# Patient Record
Sex: Female | Born: 1965 | Race: White | Hispanic: No | Marital: Single | Smoking: Never smoker
Health system: Southern US, Community
[De-identification: ages and names within clinical notes are randomized; demographics above are authoritative.]

## PROBLEM LIST (undated history)

## (undated) DIAGNOSIS — Z789 Other specified health status: Secondary | ICD-10-CM

## (undated) HISTORY — DX: Other specified health status: Z78.9

## (undated) HISTORY — PX: APPENDECTOMY: SHX54

## (undated) HISTORY — PX: INTRAUTERINE DEVICE (IUD) INSERTION: SHX5877

---

## 2004-02-20 ENCOUNTER — Ambulatory Visit: Payer: Self-pay | Admitting: General Surgery

## 2004-08-09 ENCOUNTER — Ambulatory Visit: Payer: Self-pay | Admitting: Unknown Physician Specialty

## 2005-12-28 ENCOUNTER — Ambulatory Visit: Payer: Self-pay | Admitting: Unknown Physician Specialty

## 2006-12-29 ENCOUNTER — Ambulatory Visit: Payer: Self-pay | Admitting: Gastroenterology

## 2007-05-17 ENCOUNTER — Ambulatory Visit: Payer: Self-pay | Admitting: Unknown Physician Specialty

## 2008-09-18 ENCOUNTER — Ambulatory Visit: Payer: Self-pay | Admitting: Unknown Physician Specialty

## 2009-10-27 ENCOUNTER — Ambulatory Visit: Payer: Self-pay | Admitting: Unknown Physician Specialty

## 2010-11-11 ENCOUNTER — Ambulatory Visit: Payer: Self-pay | Admitting: Unknown Physician Specialty

## 2011-06-09 ENCOUNTER — Ambulatory Visit: Payer: Self-pay | Admitting: Obstetrics and Gynecology

## 2012-03-06 LAB — HM PAP SMEAR: HM Pap smear: NORMAL

## 2012-07-06 ENCOUNTER — Ambulatory Visit: Payer: Self-pay | Admitting: Obstetrics and Gynecology

## 2016-03-29 ENCOUNTER — Ambulatory Visit (INDEPENDENT_AMBULATORY_CARE_PROVIDER_SITE_OTHER): Payer: Federal, State, Local not specified - PPO | Admitting: Obstetrics and Gynecology

## 2016-03-29 ENCOUNTER — Encounter: Payer: Self-pay | Admitting: Obstetrics and Gynecology

## 2016-03-29 VITALS — BP 122/74 | Ht 63.0 in | Wt 210.0 lb

## 2016-03-29 DIAGNOSIS — Z1231 Encounter for screening mammogram for malignant neoplasm of breast: Secondary | ICD-10-CM | POA: Diagnosis not present

## 2016-03-29 DIAGNOSIS — Z124 Encounter for screening for malignant neoplasm of cervix: Secondary | ICD-10-CM

## 2016-03-29 DIAGNOSIS — Z01419 Encounter for gynecological examination (general) (routine) without abnormal findings: Secondary | ICD-10-CM | POA: Diagnosis not present

## 2016-03-29 DIAGNOSIS — Z1239 Encounter for other screening for malignant neoplasm of breast: Secondary | ICD-10-CM

## 2016-03-29 NOTE — Progress Notes (Signed)
Routine Annual Gynecology Examination   PCP: No PCP Per Patient  Chief Complaint  Patient presents with  . Annual Exam  . Breast Pain    History of Present Illness: Patient is a 51 y.o. G1P0010 presents for annual exam. The patient has no complaints today.   Menopausal bleeding: denies  Menopausal symptoms: denies  Breast symptoms: denies  Last pap smear: 4 years ago.  Result Normal, HPV negative  Last mammogram: 2 years ago.  Result Normal  She briefly mentions a sharp, shooting breast pain that occurred one month ago.  No residual symptoms.  IUD placed in 05/2011.   Past Medical History:  History reviewed. No pertinent past medical history.  Past Surgical History:  Procedure Laterality Date  . INTRAUTERINE DEVICE (IUD) INSERTION    . INTRAUTERINE DEVICE (IUD) INSERTION      Medications:   Medication Sig Start Date End Date Taking? Authorizing Provider  levonorgestrel (MIRENA) 20 MCG/24HR IUD 1 each by Intrauterine route once.   Yes Historical Provider, MD   Allergies  Allergen Reactions  . Sulfur     Gynecologic History:  No LMP recorded. Patient is not currently having periods (Reason: IUD). Contraception: IUD  Last colonoscopy: 4 years ago with colon polyp  Obstetric History: G1P0010  Social History   Social History  . Marital status: Single    Spouse name: N/A  . Number of children: N/A  . Years of education: N/A   Occupational History  . Not on file.   Social History Main Topics  . Smoking status: Never Smoker  . Smokeless tobacco: Never Used  . Alcohol use Yes  . Drug use: No  . Sexual activity: Not Currently    Birth control/ protection: IUD   Other Topics Concern  . Not on file   Social History Narrative  . No narrative on file   Family History  Problem Relation Age of Onset  . Diabetes Mother   . Prostate cancer Father   . Breast cancer Maternal Aunt   . Colon cancer Paternal Uncle     Review of Systems  Constitutional:  Negative.   HENT: Negative.   Eyes: Negative.   Respiratory: Negative.   Cardiovascular: Negative.   Gastrointestinal: Negative.   Genitourinary: Negative.   Skin: Negative.   Neurological: Negative.   Endo/Heme/Allergies: Negative.   Psychiatric/Behavioral: Negative.      Physical Exam Vitals: BP 122/74   Ht 5\' 3"  (1.6 m)   Wt 210 lb (95.3 kg)   BMI 37.20 kg/m   Physical Exam  Constitutional: She is oriented to person, place, and time and well-developed, well-nourished, and in no distress. No distress.  HENT:  Head: Normocephalic and atraumatic.  Eyes: Conjunctivae and EOM are normal. Right eye exhibits no discharge. Left eye exhibits no discharge. No scleral icterus.  Neck: Normal range of motion. Neck supple. No tracheal deviation present. No thyromegaly present.  Cardiovascular: Normal rate and normal heart sounds.  Exam reveals no gallop and no friction rub.   No murmur heard. Pulmonary/Chest: Effort normal and breath sounds normal. No respiratory distress. She has no wheezes. She has no rales. Right breast exhibits no inverted nipple, no mass, no nipple discharge, no skin change and no tenderness. Left breast exhibits no inverted nipple, no mass, no nipple discharge, no skin change and no tenderness. Breasts are symmetrical.  Abdominal: Soft. Bowel sounds are normal. She exhibits no distension and no mass. There is no tenderness. There is no rebound and no  guarding.  Genitourinary: Vagina normal, uterus normal, cervix normal, right adnexa normal, left adnexa normal and vulva normal.  Musculoskeletal: Normal range of motion.  Lymphadenopathy:    She has no cervical adenopathy.  Neurological: She is alert and oriented to person, place, and time. No cranial nerve deficit.  Skin: Skin is warm and dry. No rash noted. No erythema. No pallor.  Psychiatric: Mood, affect and judgment normal.     Female chaperone present for pelvic and breast  portions of the physical  exam  Results: Patient declines formal depression and alcoholism screening.  But states she has no issues.  Assessment and Plan:  51 y.o. G1P0010 here for routine gynecologic annual examination. No issues identified today.   Plan: 1) Mammogram  - recommend yearly screening mammogram There are no mammo recommendations for this study. She will call [facility] to schedule.   2) STI screening was not offered as she states she has had no partner in a very long time.  3) Pap smear - ASCCP guidelines and rational discussed.  Patient opts for routine screening interval - Pap  Done today  4) Osteoporosis  - per USPTF routine screening DEXA at age 35  5) Routine healthcare maintenance including cholesterol, diabetes screening by PCP  6) Colonoscopy: per PCP, last was 4 years ago.   7) IUD: needs to be removed in 05/2016.  Patient will consider whether she wants it replaced or not.  8) Follow up 1 year for routine annual  Thomasene Mohair, MD 03/30/2051 9:32 AM

## 2016-04-02 LAB — IGP, APTIMA HPV, RFX 16/18,45
HPV APTIMA: POSITIVE — AB
HPV GENOTYPE 16: NEGATIVE
HPV GENOTYPE 18,45: NEGATIVE
PAP SMEAR COMMENT: 0

## 2016-04-05 ENCOUNTER — Encounter: Payer: Self-pay | Admitting: Obstetrics and Gynecology

## 2016-04-14 ENCOUNTER — Telehealth: Payer: Self-pay

## 2016-04-14 NOTE — Telephone Encounter (Signed)
Pt calling.  Would like to speak c SDJ.  She was seen recently and received a letter about positive HPV.  It been over a year ago since she has been active so she's thinking the virus is persisting in her system and it's concerning. [phone]

## 2016-04-19 NOTE — Telephone Encounter (Signed)
Spoke with patient and answered questions.

## 2016-07-28 ENCOUNTER — Telehealth: Payer: Self-pay

## 2016-07-28 NOTE — Telephone Encounter (Signed)
PT called triage line asking about the HPV vaccine. I left her a message to call me back

## 2016-08-16 ENCOUNTER — Ambulatory Visit
Admission: RE | Admit: 2016-08-16 | Discharge: 2016-08-16 | Disposition: A | Discharge status: Home / Self Care | Payer: Federal, State, Local not specified - PPO | Source: Ambulatory Visit | Attending: Obstetrics and Gynecology | Admitting: Obstetrics and Gynecology

## 2016-08-16 ENCOUNTER — Inpatient Hospital Stay: Admission: RE | Admit: 2016-08-16 | Payer: Self-pay | Source: Ambulatory Visit

## 2016-08-16 DIAGNOSIS — Z1231 Encounter for screening mammogram for malignant neoplasm of breast: Secondary | ICD-10-CM | POA: Insufficient documentation

## 2016-08-16 DIAGNOSIS — Z01419 Encounter for gynecological examination (general) (routine) without abnormal findings: Secondary | ICD-10-CM

## 2016-08-16 DIAGNOSIS — Z1239 Encounter for other screening for malignant neoplasm of breast: Secondary | ICD-10-CM

## 2016-10-25 ENCOUNTER — Ambulatory Visit (INDEPENDENT_AMBULATORY_CARE_PROVIDER_SITE_OTHER): Payer: Federal, State, Local not specified - PPO | Admitting: Obstetrics and Gynecology

## 2016-10-25 ENCOUNTER — Ambulatory Visit: Payer: Federal, State, Local not specified - PPO | Admitting: Obstetrics and Gynecology

## 2016-10-25 ENCOUNTER — Encounter: Payer: Self-pay | Admitting: Obstetrics and Gynecology

## 2016-10-25 VITALS — BP 128/84 | Wt 210.0 lb

## 2016-10-25 DIAGNOSIS — R3915 Urgency of urination: Secondary | ICD-10-CM

## 2016-10-25 LAB — POCT URINALYSIS DIPSTICK
Bilirubin, UA: NEGATIVE
Blood, UA: NEGATIVE
Glucose, UA: NEGATIVE
Ketones, UA: NEGATIVE
NITRITE UA: NEGATIVE
PROTEIN UA: NEGATIVE
SPEC GRAV UA: 1.015 (ref 1.010–1.025)
Urobilinogen, UA: 1 E.U./dL
pH, UA: 7 (ref 5.0–8.0)

## 2016-10-26 NOTE — Progress Notes (Signed)
Obstetrics & Gynecology Office Visit   Chief Complaint  Patient presents with  . Contraception    Birth Control consult.     History of Present Illness: 51 y.o. G84P0010 female who presents for several issues. She would like to discuss possibly receiving the HPV vaccine.  She had a positive HPV screen on her most recent pap smear (normal cytology), which was negative for HPV 16 and 18.   She also states that she has had a "heaviness" in her lower abdomen, she describes as her bladder area.  It is worse when she has a full bladder and feels better after emptying her bladder. She also notes urinary urgency, but not to the point where she has to run to the bathroom. She has not had any episodes of incontinence.  She denies urinary frequency.  She denies fevers and chills. Nothing makes it better or worse.  The discomfort is described as mild-moderate. She also wants to discuss what to do with her IUD.  She understands that the IUD expired in May this year.     Past Medical History:  Diagnosis Date  . Medical history non-contributory     Past Surgical History:  Procedure Laterality Date  . INTRAUTERINE DEVICE (IUD) INSERTION    . INTRAUTERINE DEVICE (IUD) INSERTION      Gynecologic History: No LMP recorded. Patient is not currently having periods (Reason: IUD).  Obstetric History: G1P0010  Family History  Problem Relation Age of Onset  . Diabetes Mother   . Prostate cancer Father   . Breast cancer Maternal Aunt 30  . Colon cancer Paternal Uncle     Social History   Social History  . Marital status: Single    Spouse name: N/A  . Number of children: N/A  . Years of education: N/A   Occupational History  . Not on file.   Social History Main Topics  . Smoking status: Never Smoker  . Smokeless tobacco: Never Used  . Alcohol use Yes  . Drug use: No  . Sexual activity: Not Currently    Birth control/ protection: IUD   Other Topics Concern  . Not on file   Social  History Narrative  . No narrative on file    Allergies  Allergen Reactions  . Sulfa Antibiotics Anaphylaxis    Prior to Admission medications   Medication Sig Start Date End Date Taking? Authorizing Provider  levonorgestrel (MIRENA) 20 MCG/24HR IUD 1 each by Intrauterine route once.    [provider]    Review of Systems  Constitutional: Negative for chills, fever, malaise/fatigue and weight loss.  Respiratory: Negative for cough, shortness of breath and wheezing.   Cardiovascular: Negative for chest pain and palpitations.  Gastrointestinal: Positive for abdominal pain (as described in the HPI). Negative for blood in stool, constipation, diarrhea, heartburn, melena, nausea and vomiting.  Genitourinary: Positive for dysuria. Negative for flank pain, frequency, hematuria and urgency.  Musculoskeletal: Negative for back pain, joint pain and myalgias.  Skin: Negative for itching and rash.  Neurological: Negative.   Endo/Heme/Allergies: Negative for polydipsia.  Psychiatric/Behavioral: Negative.      Physical Exam BP 128/84   Wt 210 lb (95.3 kg)   BMI 37.20 kg/m  No LMP recorded. Patient is not currently having periods (Reason: IUD). Physical Exam  Constitutional: She is oriented to person, place, and time. She appears well-developed and well-nourished. No distress.  HENT:  Head: Normocephalic and atraumatic.  Cardiovascular: Normal rate and regular rhythm.  Exam reveals no  gallop and no friction rub.   No murmur heard. Pulmonary/Chest: Effort normal and breath sounds normal. No respiratory distress. She has no wheezes. She has no rales.  Abdominal: Soft. Bowel sounds are normal. She exhibits no distension and no mass. There is no tenderness. There is no rebound and no guarding.  Neurological: She is alert and oriented to person, place, and time. No cranial nerve deficit.  Psychiatric: She has a normal mood and affect. Her behavior is normal. Judgment normal.     Urinalysis: Color, UA  yellow   Clarity, UA  clear   Glucose, UA  neg   Bilirubin, UA  neg   Ketones, UA  neg   Spec Grav, UA 1.010 - 1.025 1.015   Blood, UA  neg   pH, UA 5.0 - 8.0 7.0   Protein, UA  neg   Urobilinogen, UA 0.2 or 1.0 E.U./dL 1.0   Nitrite, UA  neg   Leukocytes, UA Negative Moderate (2+)      Assessment: 51 y.o. G38P0010 female with urinary urgency  Plan: Problem List Items Addressed This Visit    None    Visit Diagnoses    Urinary urgency    -  Primary   Relevant Orders   Urine Culture   POCT urinalysis dipstick (Completed)     The patient presents with several issues. However, it appears she may have a urinary tract infection.  The UA was equivocal. I will send the urine for a culture.  She has no signs/symptoms of pyelonephritis.    We also did discuss HPV vaccine and the currently recommended age range for girls/women to receive this medication.  Discussed that the safety and efficacy of the medication has not been studied in women in her age group. The medication would likely be safe. I could not speak to the efficacy. Her insurance is unlikely to cover the cost of the 3-shot vaccine series.  She would like to know the out-of-pocket cost of the medication.    Thomasene Mohair, MD 10/26/2016 4:36 PM

## 2016-10-28 ENCOUNTER — Telehealth: Payer: Self-pay

## 2016-10-28 LAB — URINE CULTURE: ORGANISM ID, BACTERIA: NO GROWTH

## 2016-10-28 NOTE — Telephone Encounter (Signed)
Pt aware via vm 

## 2016-10-28 NOTE — Telephone Encounter (Signed)
-----   Message from Conard Novak, MD sent at 10/28/2016  2:44 PM EDT ----- Would you mind letting this patient know that her urine culture returned as negative? So, no bladder infection.  Thank you!

## 2017-01-03 ENCOUNTER — Encounter: Payer: Self-pay | Admitting: Obstetrics and Gynecology

## 2017-01-03 ENCOUNTER — Ambulatory Visit: Payer: Federal, State, Local not specified - PPO | Admitting: Obstetrics and Gynecology

## 2017-01-03 VITALS — BP 118/78 | Ht 63.0 in | Wt 212.0 lb

## 2017-01-03 DIAGNOSIS — N6324 Unspecified lump in the left breast, lower inner quadrant: Secondary | ICD-10-CM

## 2017-01-03 NOTE — Progress Notes (Signed)
Obstetrics & Gynecology Office Visit   Chief Complaint  Patient presents with  . Breast Mass   History of Present Illness: 51 y.o. 721P0010 female presents with 3 week history of left breast "lump."  The lump was noted incidentally while looking in the mirror.  The lump has not been painful.  It has not been red.  It is described as being about the size of a pencil eraser.  There is no drainage from the lump.  As of yesterday the lump has become smaller and redder.  She has had no trauma to her breast.  She has no personal history of breast cancer or lesions.   She has had no fever, chills, changes to her right breast.  She had a mammogram on 08/16/16 that was normal. She has a Mirena IUD in place, but is otherwise taking no hormonal medication.    Past Medical History:  Diagnosis Date  . Medical history non-contributory     Past Surgical History:  Procedure Laterality Date  . INTRAUTERINE DEVICE (IUD) INSERTION    . INTRAUTERINE DEVICE (IUD) INSERTION      Gynecologic History: No LMP recorded. Patient is not currently having periods (Reason: IUD).  Obstetric History: G1P0010  Family History  Problem Relation Age of Onset  . Diabetes Mother   . Prostate cancer Father   . Breast cancer Maternal Aunt 30  . Colon cancer Paternal Uncle     Social History   Socioeconomic History  . Marital status: Single    Spouse name: Not on file  . Number of children: Not on file  . Years of education: Not on file  . Highest education level: Not on file  Social Needs  . Financial resource strain: Not on file  . Food insecurity - worry: Not on file  . Food insecurity - inability: Not on file  . Transportation needs - medical: Not on file  . Transportation needs - non-medical: Not on file  Occupational History  . Not on file  Tobacco Use  . Smoking status: Never Smoker  . Smokeless tobacco: Never Used  Substance and Sexual Activity  . Alcohol use: Yes  . Drug use: No  . Sexual  activity: Not Currently    Birth control/protection: IUD  Other Topics Concern  . Not on file  Social History Narrative  . Not on file    Allergies  Allergen Reactions  . Sulfa Antibiotics Anaphylaxis    Prior to Admission medications   Medication Sig Start Date End Date Taking? Authorizing Provider  levonorgestrel (MIRENA) 20 MCG/24HR IUD 1 each by Intrauterine route once.    [provider]    Review of Systems  Constitutional: Negative.   HENT: Negative.   Eyes: Negative.   Respiratory: Negative.   Cardiovascular: Negative.   Gastrointestinal: Negative.   Genitourinary: Negative.   Musculoskeletal: Negative.   Skin: Negative.        See HPI  Neurological: Negative.   Psychiatric/Behavioral: Negative.      Physical Exam BP 118/78   Ht 5\' 3"  (1.6 m)   Wt 212 lb (96.2 kg)   BMI 37.55 kg/m  No LMP recorded. Patient is not currently having periods (Reason: IUD). Physical Exam  Constitutional: She is oriented to person, place, and time. She appears well-developed and well-nourished. No distress.  HENT:  Head: Normocephalic and atraumatic.  Neck: Normal range of motion. Neck supple. No thyromegaly present.  Cardiovascular: Normal rate and regular rhythm.  Pulmonary/Chest: Effort normal and  breath sounds normal. Right breast exhibits no inverted nipple, no mass, no nipple discharge, no skin change and no tenderness. Left breast exhibits skin change. Left breast exhibits no inverted nipple, no mass, no nipple discharge and no tenderness.  No lymphadenopathy in the area of concern.  No underlying mass or apparent desmoplastic response.  The entire rest of the breast exam is unremarkable.    Lymphadenopathy:    She has no cervical adenopathy.  Neurological: She is alert and oriented to person, place, and time.  Psychiatric: She has a normal mood and affect. Her behavior is normal. Judgment normal.    Female chaperone present for pelvic and breast  portions of  the physical exam  Assessment: 51 y.o. 501P0010 female here for  1. Breast lump on left side at 7 o'clock position      Plan: Problem List Items Addressed This Visit    None    Visit Diagnoses    Breast lump on left side at 7 o'clock position    -  Primary     Patient reassured.  It appears to be resolving per the patient report.  It is a small lesion. I asked her to monitor the lesion for the next couple of weeks. If no improvement, then we will obtain imaging.  She voiced understanding and agreement to this plan.   Thomasene MohairStephen Ellawyn Wogan, MD 01/03/2017 1:24 PM

## 2017-04-17 ENCOUNTER — Ambulatory Visit: Payer: Federal, State, Local not specified - PPO | Admitting: Obstetrics and Gynecology

## 2017-04-24 ENCOUNTER — Ambulatory Visit (INDEPENDENT_AMBULATORY_CARE_PROVIDER_SITE_OTHER): Payer: Federal, State, Local not specified - PPO | Admitting: Obstetrics and Gynecology

## 2017-04-24 ENCOUNTER — Encounter: Payer: Self-pay | Admitting: Obstetrics and Gynecology

## 2017-04-24 VITALS — BP 124/78 | Ht 63.0 in | Wt 210.0 lb

## 2017-04-24 DIAGNOSIS — Z113 Encounter for screening for infections with a predominantly sexual mode of transmission: Secondary | ICD-10-CM | POA: Diagnosis not present

## 2017-04-24 DIAGNOSIS — Z124 Encounter for screening for malignant neoplasm of cervix: Secondary | ICD-10-CM | POA: Diagnosis not present

## 2017-04-24 DIAGNOSIS — Z01419 Encounter for gynecological examination (general) (routine) without abnormal findings: Secondary | ICD-10-CM | POA: Diagnosis not present

## 2017-04-24 DIAGNOSIS — N644 Mastodynia: Secondary | ICD-10-CM

## 2017-04-24 LAB — HM PAP SMEAR

## 2017-04-24 NOTE — Progress Notes (Addendum)
Routine Annual Gynecology Examination   PCP: Patient, No Pcp Per  Chief Complaint  Patient presents with  . Annual Exam  . Gynecologic Exam   History of Present Illness: Patient is a 52 y.o. G1P0010 presents for annual exam. The patient has no complaints today.   Menopausal bleeding: denies  Menopausal symptoms: denies  Breast symptoms: Continued pain in left breast, located left upper outer quadrant and midline upper breast.  No new lumps or bumps.  Feels like pinching senstation. It is tolerable.  Pain score 2/10.  Nothing makes better or worse.  No associated symptoms.   Last pap smear: 1 years ago.  Result NILM, HPV positive  Last mammogram: 9 months ago.  Result Normal  Past Medical History:  Diagnosis Date  . Medical history non-contributory     Past Surgical History:  Procedure Laterality Date  . INTRAUTERINE DEVICE (IUD) INSERTION    . INTRAUTERINE DEVICE (IUD) INSERTION      Prior to Admission medications   Medication Sig Start Date End Date Taking? Authorizing Provider  levonorgestrel (MIRENA) 20 MCG/24HR IUD 1 each by Intrauterine route once.    [provider]    Allergies  Allergen Reactions  . Sulfa Antibiotics Anaphylaxis   Obstetric History: G1P0010  Social History   Socioeconomic History  . Marital status: Single    Spouse name: Not on file  . Number of children: Not on file  . Years of education: Not on file  . Highest education level: Not on file  Occupational History  . Not on file  Social Needs  . Financial resource strain: Not on file  . Food insecurity:    Worry: Not on file    Inability: Not on file  . Transportation needs:    Medical: Not on file    Non-medical: Not on file  Tobacco Use  . Smoking status: Never Smoker  . Smokeless tobacco: Never Used  Substance and Sexual Activity  . Alcohol use: Yes  . Drug use: No  . Sexual activity: Not Currently    Birth control/protection: IUD  Lifestyle  . Physical  activity:    Days per week: Not on file    Minutes per session: Not on file  . Stress: Not on file  Relationships  . Social connections:    Talks on phone: Not on file    Gets together: Not on file    Attends religious service: Not on file    Active member of club or organization: Not on file    Attends meetings of clubs or organizations: Not on file    Relationship status: Not on file  . Intimate partner violence:    Fear of current or ex partner: Not on file    Emotionally abused: Not on file    Physically abused: Not on file    Forced sexual activity: Not on file  Other Topics Concern  . Not on file  Social History Narrative  . Not on file    Family History  Problem Relation Age of Onset  . Diabetes Mother   . Prostate cancer Father   . Breast cancer Maternal Aunt 30  . Colon cancer Paternal Uncle     Review of Systems  Constitutional: Negative.   HENT: Negative.   Eyes: Negative.   Respiratory: Negative.   Cardiovascular: Negative.   Gastrointestinal: Negative.   Genitourinary: Negative.   Musculoskeletal: Negative.   Skin:       See HPI for breast complaints  Neurological: Negative.   Psychiatric/Behavioral: Negative.      Physical Exam Vitals: BP 124/78   Ht 5\' 3"  (1.6 m)   Wt 210 lb (95.3 kg)   BMI 37.20 kg/m   Physical Exam  Constitutional: She is oriented to person, place, and time. She appears well-developed and well-nourished. No distress.  Genitourinary: Uterus normal. Pelvic exam was performed with patient supine. There is no rash, tenderness, lesion or injury on the right labia. There is no rash, tenderness, lesion or injury on the left labia. No erythema, tenderness or bleeding in the vagina. No signs of injury around the vagina. No vaginal discharge found. Right adnexum does not display mass, does not display tenderness and does not display fullness. Left adnexum does not display mass, does not display tenderness and does not display fullness.  Cervix does not exhibit motion tenderness, lesion, discharge or polyp.   Uterus is mobile and anteverted. Uterus is not enlarged, tender or exhibiting a mass.  HENT:  Head: Normocephalic and atraumatic.  Eyes: EOM are normal. No scleral icterus.  Neck: Normal range of motion. Neck supple. No thyromegaly present.  Cardiovascular: Normal rate and regular rhythm. Exam reveals no gallop and no friction rub.  No murmur heard. Pulmonary/Chest: Effort normal and breath sounds normal. No respiratory distress. She has no wheezes. She has no rales. Right breast exhibits no inverted nipple, no mass, no nipple discharge, no skin change and no tenderness. Left breast exhibits no inverted nipple, no mass, no nipple discharge, no skin change and no tenderness.  Abdominal: Soft. Bowel sounds are normal. She exhibits no distension and no mass. There is no tenderness. There is no rebound and no guarding.  Musculoskeletal: Normal range of motion. She exhibits no edema or tenderness.  Lymphadenopathy:    She has no cervical adenopathy.       Right: No inguinal adenopathy present.       Left: No inguinal adenopathy present.  Neurological: She is alert and oriented to person, place, and time. No cranial nerve deficit.  Skin: Skin is warm and dry. No rash noted. No erythema.  Psychiatric: She has a normal mood and affect. Her behavior is normal. Judgment normal.     Female chaperone present for pelvic and breast  portions of the physical exam    Assessment and Plan:  52 y.o. 321P0010 female here for routine annual gynecologic examination  Plan: Problem List Items Addressed This Visit    None    Visit Diagnoses    Women's annual routine gynecological examination    -  Primary   Relevant Orders   IGP, Aptima HPV, rfx 16/18,45   Pap smear for cervical cancer screening       Relevant Orders   IGP, Aptima HPV, rfx 16/18,45   Screen for STD (sexually transmitted disease)       Relevant Orders   IGP,  Aptima HPV, rfx 16/18,45   Breast pain, left       Relevant Orders   MM DIAG BREAST TOMO UNI LEFT   US BREAST LTD UNI LEFT INC AXILLA      Screening: -- Blood pressure screen normal -- Colonoscopy - not due. last 5 years ago -- Mammogram - will get diagnostic mammogram due to persistent symptoms -- Weight screening: obese: discussed management options, including lifestyle, dietary, and exercise. -- Depression screening negative (PHQ-9) -- Nutrition: normal -- cholesterol screening: per PCP -- osteoporosis screening: not due -- tobacco screening: not using -- alcohol screening: AUDIT questionnaire  indicates low-risk usage. -- family history of breast cancer screening: done. not at high risk. -- no evidence of domestic violence or intimate partner violence. -- STD screening: gonorrhea/chlamydia NAAT not collected per patient request. -- pap smear collected per ASCCP guidelines -- flu vaccine received this year -- HPV vaccination series: not eligilbe  Thomasene Mohair, MD 04/25/2017 1:30 PM

## 2017-04-25 NOTE — Addendum Note (Signed)
Addended by: Thomasene MohairJACKSON, Kysa Calais D on: 04/25/2017 01:30 PM   Modules accepted: Orders

## 2017-04-27 LAB — IGP, APTIMA HPV, RFX 16/18,45
HPV Aptima: POSITIVE — AB
HPV Genotype 16: NEGATIVE
HPV Genotype 18,45: NEGATIVE
PAP SMEAR COMMENT: 0

## 2017-05-02 ENCOUNTER — Other Ambulatory Visit: Payer: Federal, State, Local not specified - PPO

## 2017-05-02 ENCOUNTER — Telehealth: Payer: Self-pay | Admitting: Obstetrics and Gynecology

## 2017-05-02 NOTE — Telephone Encounter (Signed)
Left generic VM 

## 2017-05-03 NOTE — Telephone Encounter (Signed)
Patient following up on lab results.

## 2017-05-03 NOTE — Telephone Encounter (Signed)
Patient is calling for labs results. Please advise. 

## 2017-05-04 NOTE — Telephone Encounter (Signed)
Patient is returning missed call. Please advise 

## 2017-05-04 NOTE — Telephone Encounter (Signed)
Left generic vm 

## 2017-05-05 NOTE — Telephone Encounter (Signed)
Left generic VM 

## 2017-05-08 ENCOUNTER — Ambulatory Visit
Admission: RE | Admit: 2017-05-08 | Discharge: 2017-05-08 | Disposition: A | Discharge status: Home / Self Care | Payer: Federal, State, Local not specified - PPO | Source: Ambulatory Visit | Attending: Obstetrics and Gynecology | Admitting: Obstetrics and Gynecology

## 2017-05-08 ENCOUNTER — Telehealth: Payer: Self-pay | Admitting: Obstetrics and Gynecology

## 2017-05-08 DIAGNOSIS — N644 Mastodynia: Secondary | ICD-10-CM | POA: Insufficient documentation

## 2017-05-08 DIAGNOSIS — N6002 Solitary cyst of left breast: Secondary | ICD-10-CM | POA: Diagnosis not present

## 2017-05-08 NOTE — Telephone Encounter (Signed)
Left generic vm 

## 2017-05-15 NOTE — Telephone Encounter (Signed)
Patient is returning missed call. Please advise . Best time to reach patient is 05/16/17 before 1 pm and after 3 pm. Please call back

## 2017-05-16 NOTE — Telephone Encounter (Signed)
Pt has not called back, do you want to try again or send letter?

## 2017-05-16 NOTE — Telephone Encounter (Signed)
Duplicate msg, other one assigned to [facility] for call back

## 2017-05-17 ENCOUNTER — Encounter: Payer: Self-pay | Admitting: Obstetrics and Gynecology

## 2017-05-17 NOTE — Telephone Encounter (Signed)
Spoke w patient and gave results. She understands to schedule colposcopy. She understands the risk of developing cancer if she does not follow up with these types of results.

## 2017-05-17 NOTE — Telephone Encounter (Signed)
Please advise patient on results. Thank you

## 2017-06-01 ENCOUNTER — Telehealth: Payer: Self-pay

## 2017-06-01 NOTE — Telephone Encounter (Signed)
Pt called triage wondering about taking ibuprofen before Colpo tomorrow. I advised her to try tylenol instead since he will more than likely do bx and ibuprofen can thin your blood. Pt states she will just bring ibuprofen and take it right after colpo.

## 2017-06-02 ENCOUNTER — Encounter: Payer: Self-pay | Admitting: Obstetrics and Gynecology

## 2017-06-02 ENCOUNTER — Ambulatory Visit (INDEPENDENT_AMBULATORY_CARE_PROVIDER_SITE_OTHER): Payer: Federal, State, Local not specified - PPO | Admitting: Obstetrics and Gynecology

## 2017-06-02 VITALS — BP 110/70 | Ht 63.0 in | Wt 209.0 lb

## 2017-06-02 DIAGNOSIS — R87619 Unspecified abnormal cytological findings in specimens from cervix uteri: Secondary | ICD-10-CM | POA: Diagnosis not present

## 2017-06-02 DIAGNOSIS — N72 Inflammatory disease of cervix uteri: Secondary | ICD-10-CM

## 2017-06-02 DIAGNOSIS — B977 Papillomavirus as the cause of diseases classified elsewhere: Secondary | ICD-10-CM | POA: Diagnosis not present

## 2017-06-02 NOTE — Patient Instructions (Signed)
Colposcopy, Care After  This sheet gives you information about how to care for yourself after your procedure. Your doctor may also give you more specific instructions. If you have problems or questions, contact your doctor.  What can I expect after the procedure?  If you did not have a tissue sample removed (did not have a biopsy), you may only have some spotting for a few days. You can go back to your normal activities.  If you had a tissue sample removed, it is common to have:  · Soreness and pain. This may last for a few days.  · Light-headedness.  · Mild bleeding from your vagina or dark-colored, grainy discharge from your vagina. This may last for a few days. You may need to wear a sanitary pad.  · Spotting for at least 48 hours after the procedure.    Follow these instructions at home:  · Take over-the-counter and prescription medicines only as told by your doctor. Ask your doctor what medicines you can start taking again. This is very important if you take blood-thinning medicine.  · Do not drive or use heavy machinery while taking prescription pain medicine.  · For 3 days, or as long as your doctor tells you, avoid:  ? Douching.  ? Using tampons.  ? Having sex.  · If you use birth control (contraception), keep using it.  · Limit activity for the first day after the procedure. Ask your doctor what activities are safe for you.  · It is up to you to get the results of your procedure. Ask your doctor when your results will be ready.  · Keep all follow-up visits as told by your doctor. This is important.  Contact a doctor if:  · You get a skin rash.  Get help right away if:  · You are bleeding a lot from your vagina. It is a lot of bleeding if you are using more than one pad an hour for 2 hours in a row.  · You have clumps of blood (blood clots) coming from your vagina.  · You have a fever.  · You have chills  · You have pain in your lower belly (pelvic area).  · You have signs of infection, such as vaginal  discharge that is:  ? Different than usual.  ? Yellow.  ? Bad-smelling.  · You have very pain or cramps in your lower belly that do not get better with medicine.  · You feel light-headed.  · You feel dizzy.  · You pass out (faint).  Summary  · If you did not have a tissue sample removed (did not have a biopsy), you may only have some spotting for a few days. You can go back to your normal activities.  · If you had a tissue sample removed, it is common to have mild pain and spotting for 48 hours.  · For 3 days, or as long as your doctor tells you, avoid douching, using tampons and having sex.  · Get help right away if you have bleeding, very bad pain, or signs of infection.  This information is not intended to replace advice given to you by your health care provider. Make sure you discuss any questions you have with your health care provider.  Document Released: 06/22/2007 Document Revised: 09/23/2015 Document Reviewed: 09/23/2015  Elsevier Interactive Patient Education © 2018 [employer]

## 2017-06-02 NOTE — Progress Notes (Signed)
HPI:  Melanie Soto is a 52 y.o.  G1P0010  who presents today for evaluation and management of abnormal cervical cytology.    Dysplasia History:  Positive HPV 2018 and 2019, NILM on cytology both years   OB History  Gravida Para Term Preterm AB Living  1 0 0 0 1 0  SAB TAB Ectopic Multiple Live Births  1 0 0 0 0    # Outcome Date GA Lbr Len/2nd Weight Sex Delivery Anes PTL Lv  1 SAB             Past Medical History:  Diagnosis Date  . Medical history non-contributory     Past Surgical History:  Procedure Laterality Date  . INTRAUTERINE DEVICE (IUD) INSERTION    . INTRAUTERINE DEVICE (IUD) INSERTION      SOCIAL HISTORY:  Social History   Substance and Sexual Activity  Alcohol Use Yes    Social History   Substance and Sexual Activity  Drug Use No     Family History  Problem Relation Age of Onset  . Diabetes Mother   . Prostate cancer Father   . Breast cancer Maternal Aunt 30  . Colon cancer Paternal Uncle     ALLERGIES:  Sulfa antibiotics  Current Outpatient Medications on File Prior to Visit  Medication Sig Dispense Refill  . levonorgestrel (MIRENA) 20 MCG/24HR IUD 1 each by Intrauterine route once.     No current facility-administered medications on file prior to visit.     Physical Exam: -Vitals:  BP 110/70   Ht  (1.6 m)   Wt 209 lb (94.8 kg)   BMI 37.02 kg/m  GEN: WD, WN, NAD.  A+ O x 3, good mood and affect. ABD:  NT, ND.  Soft, no masses.  No hernias noted.   Pelvic:   Vulva: Normal appearance.  No lesions.  Vagina: No lesions or abnormalities noted.  Support: Normal pelvic support.  Urethra No masses tenderness or scarring.  Meatus Normal size without lesions or prolapse.  Cervix: See below.  Anus: Normal exam.  No lesions.  Perineum: Normal exam.  No lesions.        Bimanual   Uterus: Normal size.  Non-tender.  Mobile.  AV.  Adnexae: No masses.  Non-tender to palpation.  Cul-de-sac: Negative for abnormality.    PROCEDURE: 1.  Urine Pregnancy Test:  not done 2.  Colposcopy performed with 4% acetic acid after verbal consent obtained                                         -Aceto-white Lesions Location(s): mildly, diffusely               -Biopsy performed at 6 and 12 o'clock               -ECC indicated and performed: Yes.       -Biopsy sites made hemostatic with pressure, AgNO3, and/or Monsel's solution   -Satisfactory colposcopy: No.    -Evidence of Invasive cervical CA :  NO  ASSESSMENT:  Melanie Soto is a 52 y.o. G1P0010 here for  1. Abnormal cervical Papanicolaou smear, unspecified abnormal pap finding   2. High risk human papilloma virus (HPV) infection of cervix   .  PLAN: I discussed the grading system of pap smears and HPV high risk viral types.  We will discuss and base management after  colpo results return.      Thomasene Mohair, MD  [facility], [facility] 06/02/2017  10:03 AM

## 2017-06-06 LAB — PATHOLOGY

## 2017-06-15 ENCOUNTER — Telehealth: Payer: Self-pay

## 2017-06-15 NOTE — Telephone Encounter (Signed)
Pt calling requesting pathology results. Pt aware that once SDJ reviews them he will call with results. Pt would like them mailed to her after you speak with her please.

## 2017-06-15 NOTE — Telephone Encounter (Signed)
Left generic VM 

## 2017-06-29 ENCOUNTER — Encounter: Payer: Self-pay | Admitting: Obstetrics and Gynecology

## 2017-06-29 NOTE — Telephone Encounter (Signed)
Discussed normal results. Recommend follow up pap smear in about 1 year. Will mail her a copy of the pathology results.

## 2018-07-19 ENCOUNTER — Ambulatory Visit: Payer: Federal, State, Local not specified - PPO | Admitting: Obstetrics and Gynecology

## 2018-07-30 ENCOUNTER — Ambulatory Visit (INDEPENDENT_AMBULATORY_CARE_PROVIDER_SITE_OTHER): Payer: Federal, State, Local not specified - PPO | Admitting: Obstetrics and Gynecology

## 2018-07-30 ENCOUNTER — Other Ambulatory Visit (HOSPITAL_COMMUNITY)
Admission: RE | Admit: 2018-07-30 | Discharge: 2018-07-30 | Disposition: A | Discharge status: Home / Self Care | Payer: Federal, State, Local not specified - PPO | Source: Ambulatory Visit | Attending: Obstetrics and Gynecology | Admitting: Obstetrics and Gynecology

## 2018-07-30 ENCOUNTER — Other Ambulatory Visit: Payer: Self-pay

## 2018-07-30 ENCOUNTER — Encounter: Payer: Self-pay | Admitting: Obstetrics and Gynecology

## 2018-07-30 VITALS — BP 124/78 | Ht 64.0 in | Wt 205.0 lb

## 2018-07-30 DIAGNOSIS — Z30432 Encounter for removal of intrauterine contraceptive device: Secondary | ICD-10-CM | POA: Diagnosis not present

## 2018-07-30 DIAGNOSIS — Z1339 Encounter for screening examination for other mental health and behavioral disorders: Secondary | ICD-10-CM

## 2018-07-30 DIAGNOSIS — Z01419 Encounter for gynecological examination (general) (routine) without abnormal findings: Secondary | ICD-10-CM

## 2018-07-30 DIAGNOSIS — R8789 Other abnormal findings in specimens from female genital organs: Secondary | ICD-10-CM | POA: Insufficient documentation

## 2018-07-30 DIAGNOSIS — R87618 Other abnormal cytological findings on specimens from cervix uteri: Secondary | ICD-10-CM

## 2018-07-30 DIAGNOSIS — Z124 Encounter for screening for malignant neoplasm of cervix: Secondary | ICD-10-CM | POA: Insufficient documentation

## 2018-07-30 DIAGNOSIS — Z1331 Encounter for screening for depression: Secondary | ICD-10-CM

## 2018-07-30 NOTE — Progress Notes (Signed)
Routine Annual Gynecology Examination   PCP: Conard NovakJackson, Jordyn Hofacker D, MD  Chief Complaint  Patient presents with  . Annual Exam   History of Present Illness: Patient is a 53 y.o. G1P0010 presents for annual exam. The patient has no complaints today.   Menopausal bleeding: denies  Menopausal symptoms: denies  Breast symptoms: still with occasional left breast pain.  Not present today or recently.  Last pap smear: 1 years ago.  Result Normal, HPV POSITIVE (also in 2018), s/p normal colposcopy in 2019.   Last mammogram: 1 year ago.  Result: Birads 2  Last Colonoscopy: 6 years ago.  Not due  Past Medical History:  Diagnosis Date  . Medical history non-contributory    Past Surgical History:  Procedure Laterality Date  . INTRAUTERINE DEVICE (IUD) INSERTION    . INTRAUTERINE DEVICE (IUD) INSERTION      Prior to Admission medications   Medication Sig Start Date End Date Taking? Authorizing Provider  levonorgestrel (MIRENA) 20 MCG/24HR IUD 1 each by Intrauterine route once.   Yes [provider]    Allergies  Allergen Reactions  . Sulfa Antibiotics Anaphylaxis    Obstetric History: G1P0010  Social History   Socioeconomic History  . Marital status: Single    Spouse name: Not on file  . Number of children: Not on file  . Years of education: Not on file  . Highest education level: Not on file  Occupational History  . Not on file  Social Needs  . Financial resource strain: Not on file  . Food insecurity    Worry: Not on file    Inability: Not on file  . Transportation needs    Medical: Not on file    Non-medical: Not on file  Tobacco Use  . Smoking status: Never Smoker  . Smokeless tobacco: Never Used  Substance and Sexual Activity  . Alcohol use: Yes  . Drug use: No  . Sexual activity: Not Currently    Birth control/protection: I.U.D.  Lifestyle  . Physical activity    Days per week: Not on file    Minutes per session: Not on file  . Stress: Not  on file  Relationships  . Social Musicianconnections    Talks on phone: Not on file    Gets together: Not on file    Attends religious service: Not on file    Active member of club or organization: Not on file    Attends meetings of clubs or organizations: Not on file    Relationship status: Not on file  . Intimate partner violence    Fear of current or ex partner: Not on file    Emotionally abused: Not on file    Physically abused: Not on file    Forced sexual activity: Not on file  Other Topics Concern  . Not on file  Social History Narrative  . Not on file    Family History  Problem Relation Age of Onset  . Diabetes Mother   . Prostate cancer Father   . Breast cancer Maternal Aunt 30  . Colon cancer Paternal Uncle     Review of Systems  Constitutional: Negative.   HENT: Negative.   Eyes: Negative.   Respiratory: Negative.   Cardiovascular: Negative.   Gastrointestinal: Negative.   Genitourinary: Negative.   Musculoskeletal: Negative.   Skin: Negative.   Neurological: Negative.   Psychiatric/Behavioral: Negative.      Physical Exam Vitals: BP 124/78   Ht 5\' 4"  (1.626 m)  Wt 205 lb (93 kg)   BMI 35.19 kg/m   Physical Exam Constitutional:      General: She is not in acute distress.    Appearance: Normal appearance. She is well-developed.  Genitourinary:     Pelvic exam was performed with patient supine.     Vulva, urethra, bladder and uterus normal.     No inguinal adenopathy present in the right or left side.    No signs of injury in the vagina.     No vaginal discharge, erythema, tenderness or bleeding.     No cervical motion tenderness, discharge, lesion or polyp.     Uterus is mobile.     Uterus is not enlarged or tender.     No uterine mass detected.    Uterus is anteverted.     No right or left adnexal mass present.     Right adnexa not tender or full.     Left adnexa not tender or full.  HENT:     Head: Normocephalic and atraumatic.  Eyes:      General: No scleral icterus.    Conjunctiva/sclera: Conjunctivae normal.  Neck:     Musculoskeletal: Normal range of motion and neck supple.     Thyroid: No thyromegaly.  Cardiovascular:     Rate and Rhythm: Normal rate and regular rhythm.     Heart sounds: No murmur. No friction rub. No gallop.   Pulmonary:     Effort: Pulmonary effort is normal. No respiratory distress.     Breath sounds: Normal breath sounds. No wheezing or rales.  Chest:     Breasts:        Right: No inverted nipple, mass, nipple discharge, skin change or tenderness.        Left: No inverted nipple, mass, nipple discharge, skin change or tenderness.  Abdominal:     General: Bowel sounds are normal. There is no distension.     Palpations: Abdomen is soft. There is no mass.     Tenderness: There is no abdominal tenderness. There is no guarding or rebound.  Musculoskeletal: Normal range of motion.        General: No swelling or tenderness.  Lymphadenopathy:     Cervical: No cervical adenopathy.     Lower Body: No right inguinal adenopathy. No left inguinal adenopathy.  Neurological:     General: No focal deficit present.     Mental Status: She is alert and oriented to person, place, and time.     Cranial Nerves: No cranial nerve deficit.  Skin:    General: Skin is warm and dry.     Findings: No erythema or rash.  Psychiatric:        Mood and Affect: Mood normal.        Behavior: Behavior normal.        Judgment: Judgment normal.     IUD Removal  Patient identified, informed consent performed, consent signed.  Patient was in the dorsal lithotomy position, normal external genitalia was noted.  A speculum was placed in the patient's vagina, normal discharge was noted, no lesions. The cervix was visualized, no lesions, no abnormal discharge.  The strings of the IUD were grasped and pulled using ring forceps. The IUD was removed in its entirety. Patient tolerated the procedure well.    Patient will use nothing for  contraception  Female chaperone present for pelvic and breast  portions of the physical exam  Results: AUDIT Questionnaire (screen for alcoholism): 1 PHQ-9: 0  Assessment and Plan:  53 y.o. G40P0010 female here for routine annual gynecologic examination  Plan: Problem List Items Addressed This Visit    None    Visit Diagnoses    Women's annual routine gynecological examination    -  Primary   Relevant Orders   Cytology - PAP   Screening for depression       Screening for alcoholism       Pap smear for cervical cancer screening       Relevant Orders   Cytology - PAP   Pap smear abnormality of cervix/human papillomavirus (HPV) positive       Relevant Orders   Cytology - PAP   Encounter for IUD removal          Screening: -- Blood pressure screen normal -- Colonoscopy - not due -- Mammogram - due. Patient to call [facility] to arrange. She understands that it is her responsibility to arrange this. -- Weight screening: obese: discussed management options, including lifestyle, dietary, and exercise. -- Depression screening negative (PHQ-9) -- Nutrition: normal -- cholesterol screening: per PCP -- osteoporosis screening: not due -- tobacco screening: not using -- alcohol screening: AUDIT questionnaire indicates low-risk usage. -- family history of breast cancer screening: done. not at high risk. -- no evidence of domestic violence or intimate partner violence. -- STD screening: gonorrhea/chlamydia NAAT not collected per patient request. -- pap smear collected per ASCCP guidelines  IUD Removed. She will see how things go with her menses.  It was placed in 05/2011.   Prentice Docker, MD 07/30/2018 2:21 PM

## 2018-08-01 ENCOUNTER — Encounter: Payer: Self-pay | Admitting: Obstetrics and Gynecology

## 2018-08-01 LAB — CYTOLOGY - PAP
Diagnosis: NEGATIVE
HPV: NOT DETECTED

## 2019-03-06 ENCOUNTER — Telehealth: Payer: Self-pay

## 2019-03-06 NOTE — Telephone Encounter (Signed)
Pt calling needing mammo bc she never went to get one after annual last year. It has been over 6 months, so we will just have to wait until she has her annual in 07/2019. Tried to call pt but no answer. Please let her know when she calls back

## 2019-04-23 ENCOUNTER — Other Ambulatory Visit: Payer: Self-pay | Admitting: Obstetrics and Gynecology

## 2019-04-23 DIAGNOSIS — Z1231 Encounter for screening mammogram for malignant neoplasm of breast: Secondary | ICD-10-CM

## 2019-05-03 ENCOUNTER — Ambulatory Visit
Admission: RE | Admit: 2019-05-03 | Discharge: 2019-05-03 | Disposition: A | Discharge status: Home / Self Care | Payer: Federal, State, Local not specified - PPO | Source: Ambulatory Visit | Attending: Obstetrics and Gynecology | Admitting: Obstetrics and Gynecology

## 2019-05-03 DIAGNOSIS — Z1231 Encounter for screening mammogram for malignant neoplasm of breast: Secondary | ICD-10-CM | POA: Diagnosis present

## 2019-07-24 ENCOUNTER — Telehealth: Payer: Self-pay | Admitting: Obstetrics and Gynecology

## 2019-07-24 NOTE — Telephone Encounter (Signed)
Patient is scheduled for annual exam with SDJ on 08/29/19 would also like a mirena placed at this scheduled appointment.

## 2019-07-29 NOTE — Telephone Encounter (Signed)
Noted. Will order to arrive by apt date/time. 

## 2019-08-23 NOTE — Telephone Encounter (Signed)
Mirena reserved for this patient. 

## 2019-08-29 ENCOUNTER — Ambulatory Visit: Payer: Federal, State, Local not specified - PPO | Admitting: Obstetrics and Gynecology

## 2019-09-05 ENCOUNTER — Encounter: Payer: Self-pay | Admitting: Obstetrics and Gynecology

## 2019-09-05 ENCOUNTER — Other Ambulatory Visit (HOSPITAL_COMMUNITY)
Admission: RE | Admit: 2019-09-05 | Discharge: 2019-09-05 | Disposition: A | Discharge status: Home / Self Care | Payer: Federal, State, Local not specified - PPO | Source: Ambulatory Visit | Attending: Obstetrics and Gynecology | Admitting: Obstetrics and Gynecology

## 2019-09-05 ENCOUNTER — Other Ambulatory Visit: Payer: Self-pay

## 2019-09-05 ENCOUNTER — Ambulatory Visit (INDEPENDENT_AMBULATORY_CARE_PROVIDER_SITE_OTHER): Payer: Federal, State, Local not specified - PPO | Admitting: Obstetrics and Gynecology

## 2019-09-05 VITALS — BP 116/69 | Ht 63.0 in | Wt 214.0 lb

## 2019-09-05 DIAGNOSIS — Z1331 Encounter for screening for depression: Secondary | ICD-10-CM | POA: Diagnosis not present

## 2019-09-05 DIAGNOSIS — Z1339 Encounter for screening examination for other mental health and behavioral disorders: Secondary | ICD-10-CM

## 2019-09-05 DIAGNOSIS — Z124 Encounter for screening for malignant neoplasm of cervix: Secondary | ICD-10-CM

## 2019-09-05 DIAGNOSIS — Z01419 Encounter for gynecological examination (general) (routine) without abnormal findings: Secondary | ICD-10-CM | POA: Insufficient documentation

## 2019-09-05 NOTE — Progress Notes (Signed)
Routine Annual Gynecology Examination   PCP: Conard Novak, MD  Chief Complaint  Patient presents with  . Gynecologic Exam  . Contraception    Mirena insertion   History of Present Illness: Patient is a 54 y.o. G1P0010 presents for annual exam. The patient has no complaints today.   Menopausal bleeding: She notes monthly menses, lasting 5 days.  She states that her grandmother had a child while in her 9s.   Menopausal symptoms: denies  Breast symptoms: denies  Last pap smear: 1 year ago.  Result Normal, HPV negative  Last mammogram: 05/03/2019.  Result normal, BiRads 1  Last colonoscopy: 7 years ago.  Normal "Diverticulosis, polyp (sigmoid), aphthous ulcer terminal ileum without overt evidence of Crohn's, internal hemorrhoids (next due 05/04/2022)":    Past Medical History:  Diagnosis Date  . Medical history non-contributory     Past Surgical History:  Procedure Laterality Date  . INTRAUTERINE DEVICE (IUD) INSERTION    . INTRAUTERINE DEVICE (IUD) INSERTION      Prior to Admission medications   Medication Sig Start Date End Date Taking? Authorizing Provider  albuterol (VENTOLIN HFA) 108 (90 Base) MCG/ACT inhaler Inhale into the lungs. 03/07/18  Yes [provider]    Allergies  Allergen Reactions  . Sulfa Antibiotics Anaphylaxis   Obstetric History: G1P0010  Social History   Socioeconomic History  . Marital status: Single    Spouse name: Not on file  . Number of children: Not on file  . Years of education: Not on file  . Highest education level: Not on file  Occupational History  . Not on file  Tobacco Use  . Smoking status: Never Smoker  . Smokeless tobacco: Never Used  Vaping Use  . Vaping Use: Never used  Substance and Sexual Activity  . Alcohol use: Yes  . Drug use: No  . Sexual activity: Not Currently    Birth control/protection: None  Other Topics Concern  . Not on file  Social History Narrative  . Not on file   Social  Determinants of Health   Financial Resource Strain:   . Difficulty of Paying Living Expenses: Not on file  Food Insecurity:   . Worried About Programme researcher, broadcasting/film/video in the Last Year: Not on file  . Ran Out of Food in the Last Year: Not on file  Transportation Needs:   . Lack of Transportation (Medical): Not on file  . Lack of Transportation (Non-Medical): Not on file  Physical Activity:   . Days of Exercise per Week: Not on file  . Minutes of Exercise per Session: Not on file  Stress:   . Feeling of Stress : Not on file  Social Connections:   . Frequency of Communication with Friends and Family: Not on file  . Frequency of Social Gatherings with Friends and Family: Not on file  . Attends Religious Services: Not on file  . Active Member of Clubs or Organizations: Not on file  . Attends Banker Meetings: Not on file  . Marital Status: Not on file  Intimate Partner Violence:   . Fear of Current or Ex-Partner: Not on file  . Emotionally Abused: Not on file  . Physically Abused: Not on file  . Sexually Abused: Not on file    Family History  Problem Relation Age of Onset  . Diabetes Mother   . Prostate cancer Father   . Breast cancer Maternal Aunt 30  . Colon cancer Paternal Uncle  Review of Systems  Constitutional: Negative.   HENT: Negative.   Eyes: Negative.   Respiratory: Negative.   Cardiovascular: Negative.   Gastrointestinal: Negative.   Genitourinary: Negative.   Musculoskeletal: Negative.   Skin: Negative.   Neurological: Negative.   Psychiatric/Behavioral: Negative.      Physical Exam Vitals: BP 116/69   Ht 5\' 3"  (1.6 m)   Wt 214 lb (97.1 kg)   LMP 08/13/2019 (Exact Date)   BMI 37.91 kg/m   Physical Exam Constitutional:      General: She is not in acute distress.    Appearance: Normal appearance. She is well-developed.  Genitourinary:     Pelvic exam was performed with patient in the lithotomy position.     Vulva, urethra, bladder and  uterus normal.     No inguinal adenopathy present in the right or left side.    No signs of injury in the vagina.     No vaginal discharge, erythema, tenderness or bleeding.     No cervical motion tenderness, discharge, lesion or polyp.     Uterus is mobile.     Uterus is not enlarged or tender.     No uterine mass detected.    Uterus is anteverted.     No right or left adnexal mass present.     Right adnexa not tender or full.     Left adnexa not tender or full.  HENT:     Head: Normocephalic and atraumatic.  Eyes:     General: No scleral icterus.    Conjunctiva/sclera: Conjunctivae normal.  Neck:     Thyroid: No thyromegaly.  Cardiovascular:     Rate and Rhythm: Normal rate and regular rhythm.     Heart sounds: No murmur heard.  No friction rub. No gallop.   Pulmonary:     Effort: Pulmonary effort is normal. No respiratory distress.     Breath sounds: Normal breath sounds. No wheezing or rales.  Chest:     Breasts:        Right: No inverted nipple, mass, nipple discharge, skin change or tenderness.        Left: No inverted nipple, mass, nipple discharge, skin change or tenderness.  Abdominal:     General: Bowel sounds are normal. There is no distension.     Palpations: Abdomen is soft. There is no mass.     Tenderness: There is no abdominal tenderness. There is no guarding or rebound.  Musculoskeletal:        General: No swelling or tenderness. Normal range of motion.     Cervical back: Normal range of motion and neck supple.  Lymphadenopathy:     Cervical: No cervical adenopathy.     Lower Body: No right inguinal adenopathy. No left inguinal adenopathy.  Neurological:     General: No focal deficit present.     Mental Status: She is alert and oriented to person, place, and time.     Cranial Nerves: No cranial nerve deficit.  Skin:    General: Skin is warm and dry.     Findings: No erythema or rash.  Psychiatric:        Mood and Affect: Mood normal.        Behavior:  Behavior normal.        Judgment: Judgment normal.      Female chaperone present for pelvic and breast  portions of the physical exam  Results: AUDIT Questionnaire (screen for alcoholism): 1 PHQ-9: 2   Assessment and  Plan:  54 y.o. G42P0010 female here for routine annual gynecologic examination  Plan: Problem List Items Addressed This Visit    None    Visit Diagnoses    Women's annual routine gynecological examination    -  Primary   Relevant Orders   Cytology - PAP   Screening for depression       Screening for alcoholism       Pap smear for cervical cancer screening       Relevant Orders   Cytology - PAP     Screening: -- Blood pressure screen normal -- Colonoscopy - not due -- Mammogram - not due -- Weight screening: overweight: continue to monitor -- Depression screening negative (PHQ-9) -- Nutrition: normal -- cholesterol screening: per PCP -- osteoporosis screening: not due -- tobacco screening: not using -- alcohol screening: AUDIT questionnaire indicates low-risk usage. -- family history of breast cancer screening: done. not at high risk. -- no evidence of domestic violence or intimate partner violence. -- STD screening: gonorrhea/chlamydia NAAT not collected per patient request. -- pap smear collected per ASCCP guidelines  Discussed insertion of IUD in 54 year-old woman.  She is having a regular menses. I am going to check and FSH and estradiol to see where she is in the process of menopausal transition.  I don't mind placing the IUD, if her normal remain normal.  If they indicate she should be in menopause, I will initiate a workup around her vaginal bleeding.    Thomasene Mohair, MD 09/05/2019 11:34 AM

## 2019-09-09 LAB — CYTOLOGY - PAP
Adequacy: ABSENT
Comment: NEGATIVE
Diagnosis: NEGATIVE
High risk HPV: NEGATIVE

## 2019-09-21 ENCOUNTER — Encounter: Payer: Self-pay | Admitting: Obstetrics and Gynecology

## 2020-04-14 ENCOUNTER — Telehealth: Payer: Self-pay

## 2020-04-14 NOTE — Telephone Encounter (Signed)
Pt called triage left a message to return call. She did not answer, I left message to call [facility].

## 2020-05-25 HISTORY — PX: RETINAL TEAR REPAIR CRYOTHERAPY: SHX5304

## 2020-08-24 IMAGING — MG DIGITAL SCREENING BILAT W/ TOMO W/ CAD
6 of 10 series · 6 of 30 positions shown · non-contrast
Comparison: Previous exam(s).

CLINICAL DATA: Screening.

EXAM:
DIGITAL SCREENING BILATERAL MAMMOGRAM WITH TOMO AND CAD

[R MLO synth-2D (1 of 2)]
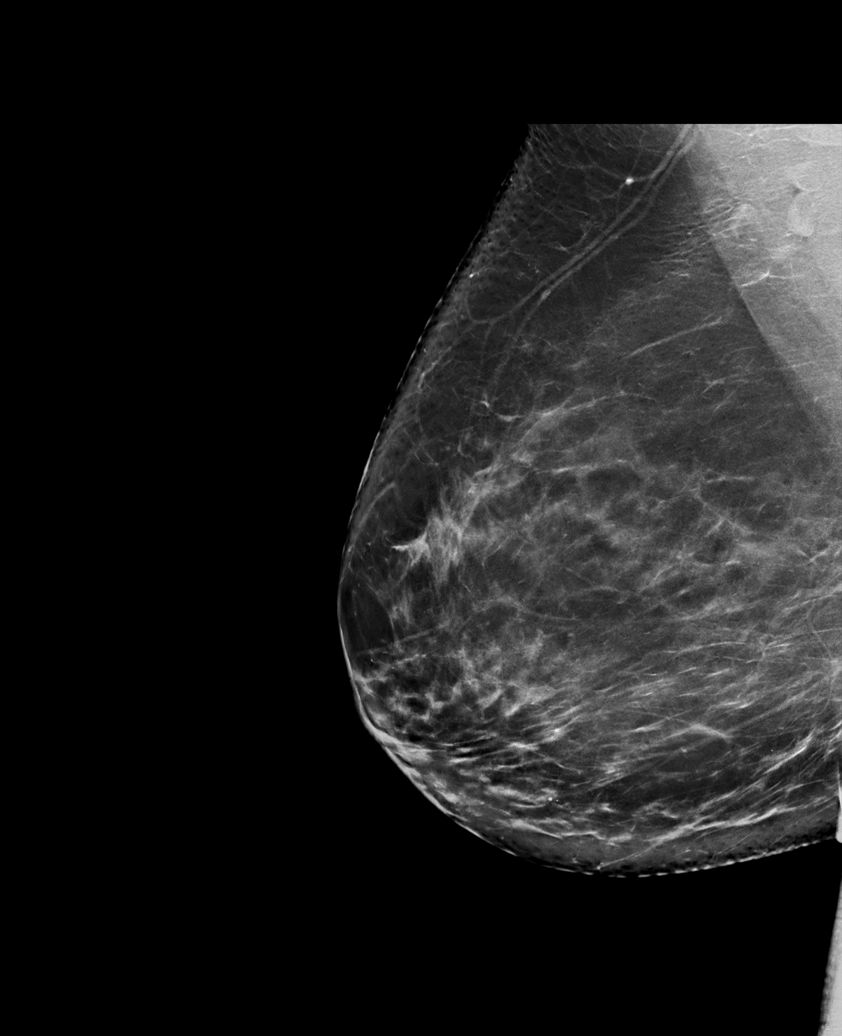

[L CC synth-2D]
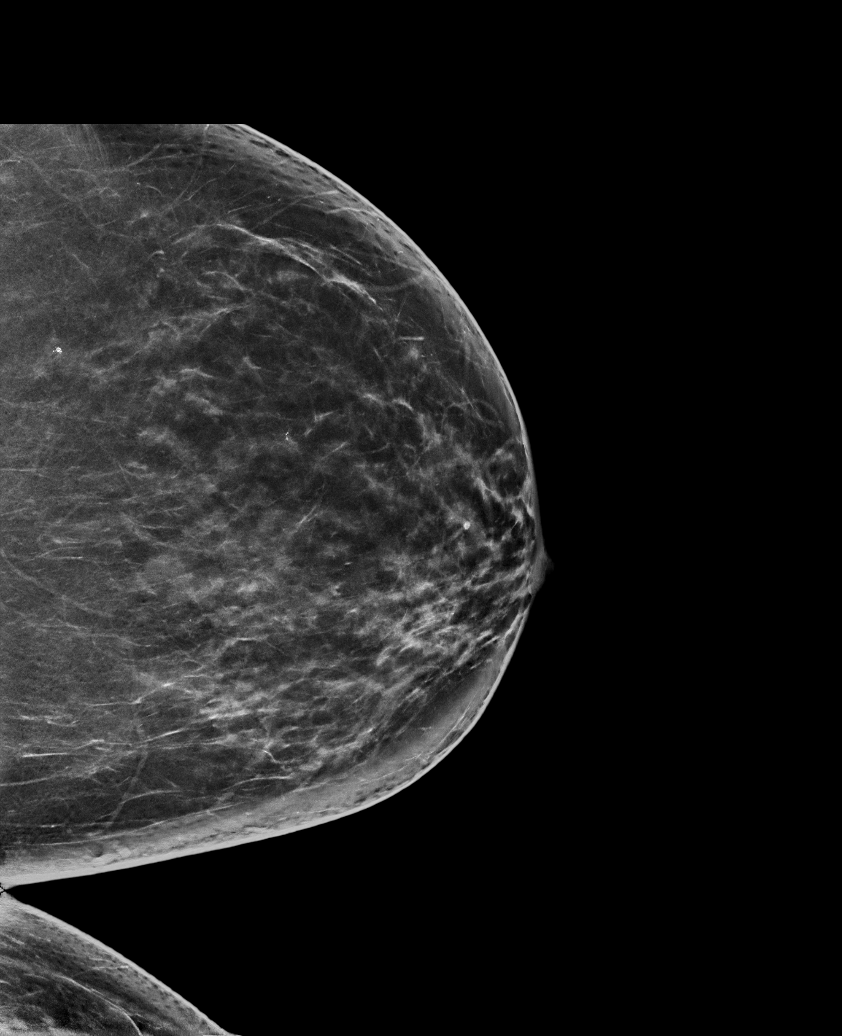

[R MLO synth-2D (2 of 2)]
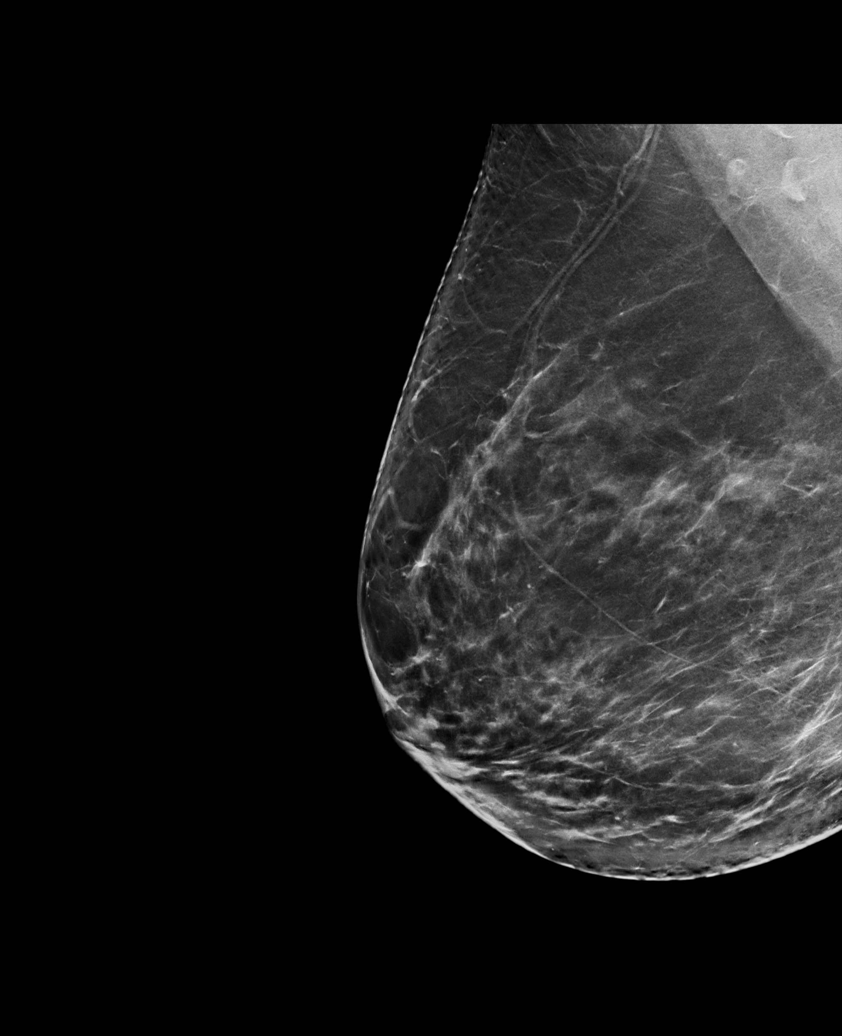

[R CC synth-2D]
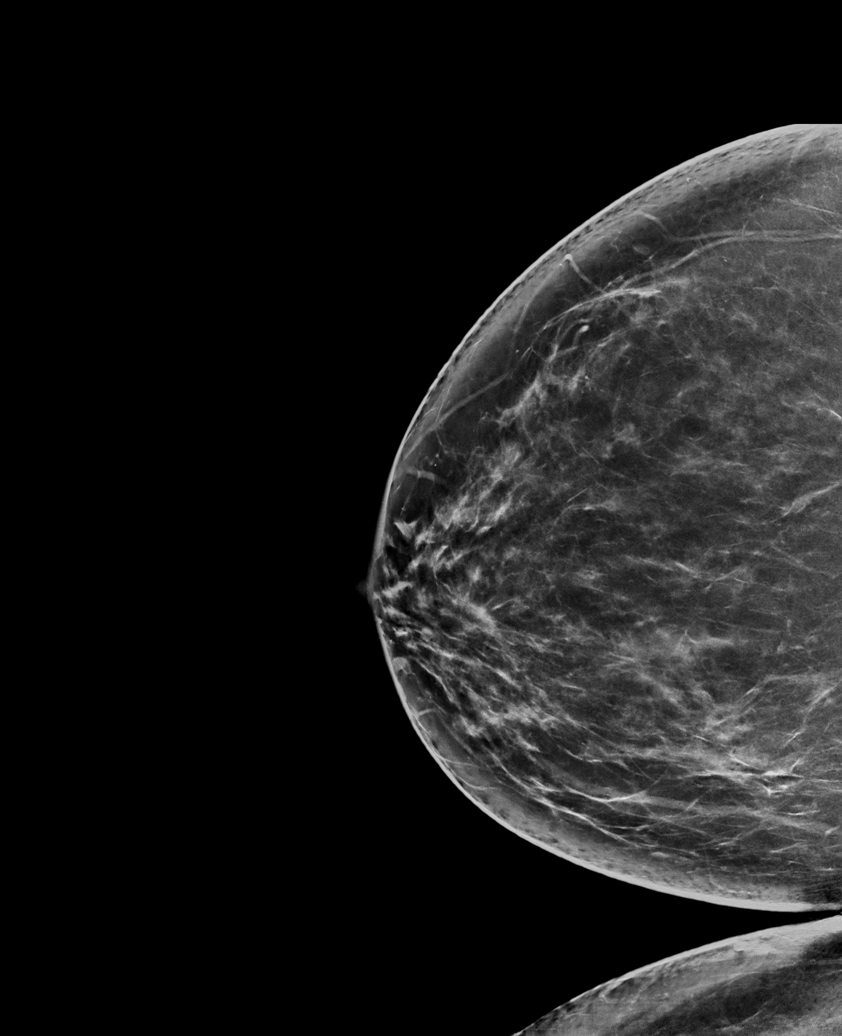

[L MLO synth-2D]
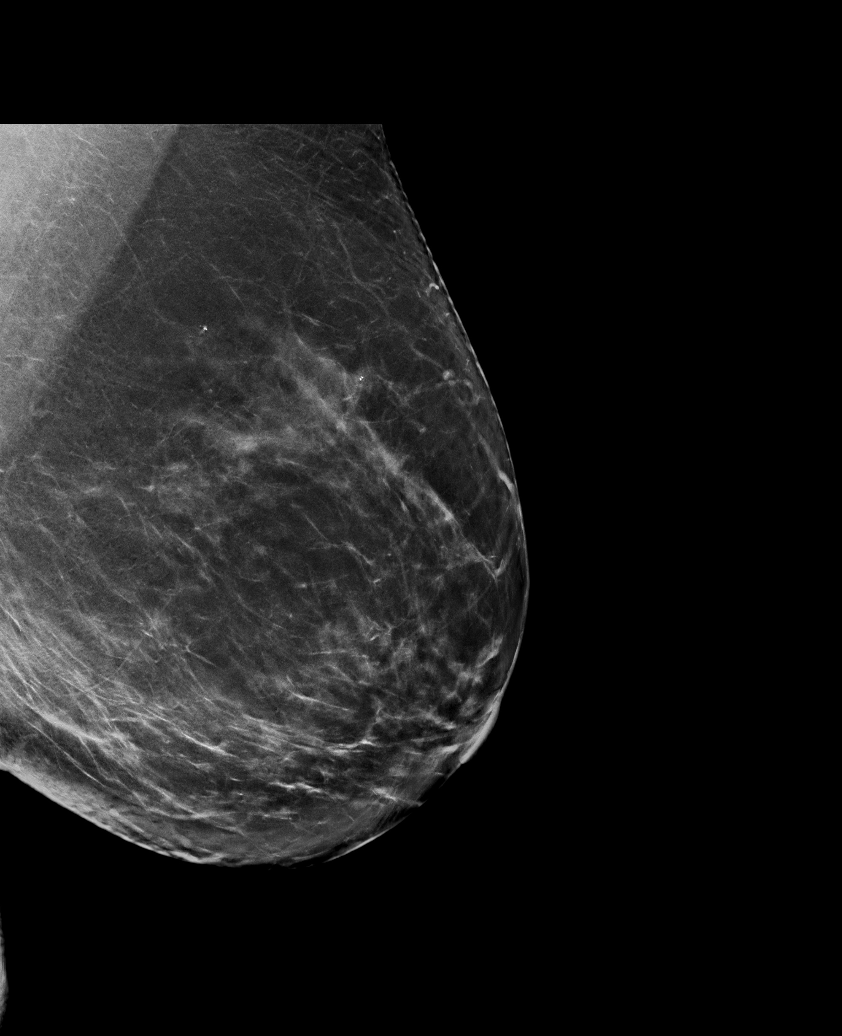

[R MLO tomo · tomo slice 49/96.0]
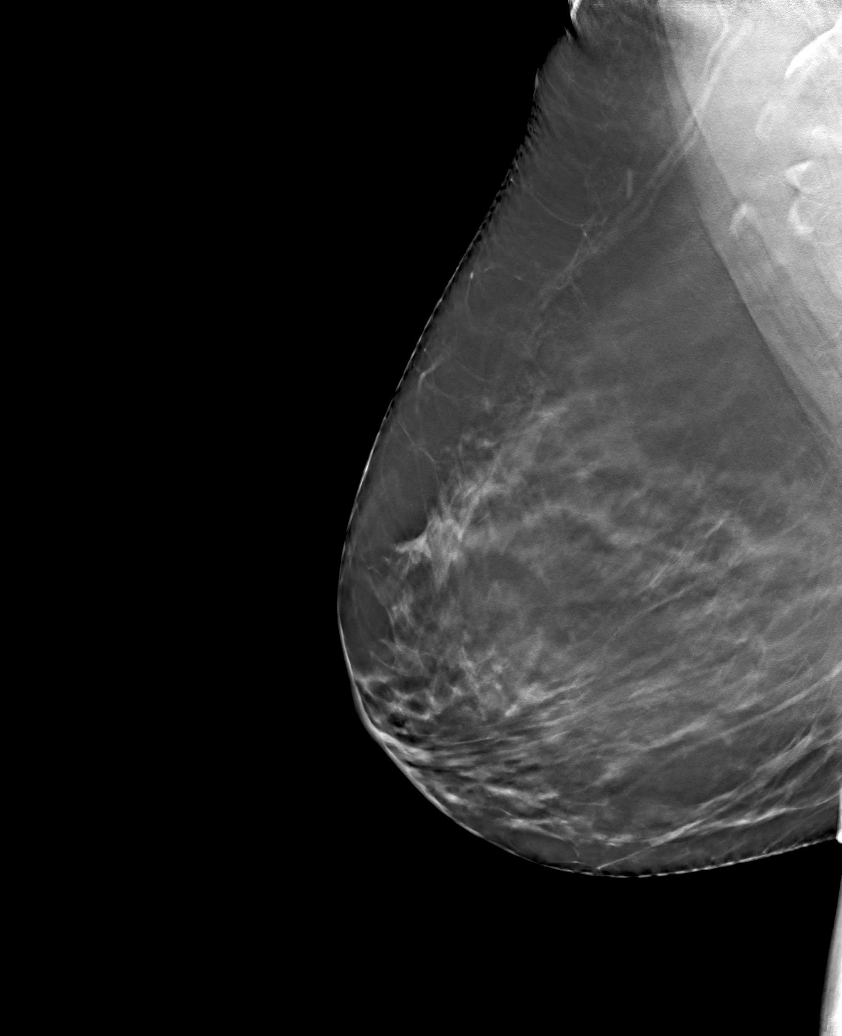

[6 of 30 positions shown; findings below may reference images not displayed]

ACR Breast Density Category c: The breast tissue is heterogeneously
dense, which may obscure small masses.
FINDINGS: There are no findings suspicious for malignancy. Images were
processed with CAD.
IMPRESSION: No mammographic evidence of malignancy. A result letter of this
screening mammogram will be mailed directly to the patient.

RECOMMENDATION:
Screening mammogram in one year. (Code:FT-U-LHB)

BI-RADS CATEGORY  1: Negative.

## 2020-09-11 ENCOUNTER — Encounter: Payer: Self-pay | Admitting: Obstetrics and Gynecology

## 2020-09-11 ENCOUNTER — Other Ambulatory Visit: Payer: Self-pay

## 2020-09-11 ENCOUNTER — Ambulatory Visit (INDEPENDENT_AMBULATORY_CARE_PROVIDER_SITE_OTHER): Payer: Federal, State, Local not specified - PPO | Admitting: Obstetrics and Gynecology

## 2020-09-11 VITALS — BP 110/62 | HR 62 | Ht 63.0 in | Wt 213.0 lb

## 2020-09-11 DIAGNOSIS — Z1339 Encounter for screening examination for other mental health and behavioral disorders: Secondary | ICD-10-CM

## 2020-09-11 DIAGNOSIS — Z01419 Encounter for gynecological examination (general) (routine) without abnormal findings: Secondary | ICD-10-CM

## 2020-09-11 DIAGNOSIS — Z1331 Encounter for screening for depression: Secondary | ICD-10-CM

## 2020-09-11 NOTE — Progress Notes (Signed)
Routine Annual Gynecology Examination   PCP: Conard Novak, MD  Chief Complaint  Patient presents with   Annual Exam   History of Present Illness: Patient is a 55 y.o. G1P0010 presents for annual exam. The patient has no complaints today.   Menopausal bleeding: Her last period was February. She has some cramping that comes at least once a week.  The cramping is described as low key. She takes ibuprofen.  But, was told that taking ibuprofen may cause her problems.    Menopausal symptoms:she is not quite considered menopausal yet.   Breast symptoms: denies  Last pap smear: 1 year ago.  Result Normal, HPV negative  Last mammogram: 05/03/2019.  Result normal, BiRads 1  Last colonoscopy: 8 years ago.  Normal "Diverticulosis, polyp (sigmoid), aphthous ulcer terminal ileum without overt evidence of Crohn's, internal hemorrhoids (next due 05/04/2022)."    Past Medical History:  Diagnosis Date   Medical history non-contributory     Past Surgical History:  Procedure Laterality Date   APPENDECTOMY     INTRAUTERINE DEVICE (IUD) INSERTION     INTRAUTERINE DEVICE (IUD) INSERTION     RETINAL TEAR REPAIR CRYOTHERAPY Right 05/25/2020    Prior to Admission medications   Medication Sig Start Date End Date Taking? Authorizing Provider  albuterol (VENTOLIN HFA) 108 (90 Base) MCG/ACT inhaler Inhale into the lungs. 03/07/18  Yes [provider]    Allergies  Allergen Reactions   Sulfa Antibiotics Anaphylaxis   Obstetric History: G1P0010  Social History   Socioeconomic History   Marital status: Single    Spouse name: Not on file   Number of children: Not on file   Years of education: Not on file   Highest education level: Not on file  Occupational History   Not on file  Tobacco Use   Smoking status: Never   Smokeless tobacco: Never  Vaping Use   Vaping Use: Never used  Substance and Sexual Activity   Alcohol use: Yes   Drug use: No   Sexual activity: Not  Currently    Birth control/protection: None  Other Topics Concern   Not on file  Social History Narrative   Not on file   Social Determinants of Health   Financial Resource Strain: Not on file  Food Insecurity: Not on file  Transportation Needs: Not on file  Physical Activity: Not on file  Stress: Not on file  Social Connections: Not on file  Intimate Partner Violence: Not on file    Family History  Problem Relation Age of Onset   Diabetes Mother    Prostate cancer Father    Breast cancer Maternal Aunt 30   Colon cancer Paternal Uncle     Review of Systems  Constitutional: Negative.   HENT: Negative.    Eyes: Negative.   Respiratory: Negative.    Cardiovascular: Negative.   Gastrointestinal: Negative.   Genitourinary: Negative.   Musculoskeletal: Negative.   Skin: Negative.   Neurological: Negative.   Psychiatric/Behavioral: Negative.      Physical Exam Vitals: BP 110/62 (Cuff Size: Normal)   Pulse 62   Ht 5\' 3"  (1.6 m)   Wt 213 lb (96.6 kg)   LMP 03/03/2020 (Approximate)   BMI 37.73 kg/m   Physical Exam Constitutional:      General: She is not in acute distress.    Appearance: Normal appearance. She is well-developed.  Genitourinary:     Vulva and bladder normal.     No vaginal discharge, erythema, tenderness  or bleeding.      Right Adnexa: not tender, not full and no mass present.    Left Adnexa: not tender, not full and no mass present.    No cervical motion tenderness, discharge, lesion or polyp.     Uterus is not enlarged or tender.     No uterine mass detected.    Pelvic exam was performed with patient in the lithotomy position.  Breasts:    Right: No inverted nipple, mass, nipple discharge, skin change or tenderness.     Left: No inverted nipple, mass, nipple discharge, skin change or tenderness.  HENT:     Head: Normocephalic and atraumatic.  Eyes:     General: No scleral icterus.    Conjunctiva/sclera: Conjunctivae normal.  Neck:      Thyroid: No thyromegaly.  Cardiovascular:     Rate and Rhythm: Normal rate and regular rhythm.     Heart sounds: No murmur heard.   No friction rub. No gallop.  Pulmonary:     Effort: Pulmonary effort is normal. No respiratory distress.     Breath sounds: Normal breath sounds. No wheezing or rales.  Abdominal:     General: Bowel sounds are normal. There is no distension.     Palpations: Abdomen is soft. There is no mass.     Tenderness: There is no abdominal tenderness. There is no guarding or rebound.  Musculoskeletal:        General: No swelling or tenderness. Normal range of motion.     Cervical back: Normal range of motion and neck supple.  Lymphadenopathy:     Cervical: No cervical adenopathy.     Lower Body: No right inguinal adenopathy. No left inguinal adenopathy.  Neurological:     General: No focal deficit present.     Mental Status: She is alert and oriented to person, place, and time.     Cranial Nerves: No cranial nerve deficit.  Skin:    General: Skin is warm and dry.     Findings: No erythema or rash.  Psychiatric:        Mood and Affect: Mood normal.        Behavior: Behavior normal.        Judgment: Judgment normal.     Female chaperone present for pelvic and breast  portions of the physical exam  Results: AUDIT Questionnaire (screen for alcoholism): pt declines PHQ-9: pt declines   Assessment and Plan:  55 y.o. G41P0010 female here for routine annual gynecologic examination  Plan: Problem List Items Addressed This Visit   None Visit Diagnoses     Women's annual routine gynecological examination    -  Primary   Screening for depression       Screening for alcoholism          Screening: -- Blood pressure screen normal -- Colonoscopy - not due -- Mammogram -  not due -- Weight screening: overweight: continue to monitor -- Depression screening negative (PHQ-9) -- Nutrition: normal -- cholesterol screening: per PCP -- osteoporosis screening: not  due -- tobacco screening: not using -- alcohol screening: AUDIT questionnaire indicates low-risk usage. -- family history of breast cancer screening: done. not at high risk. -- no evidence of domestic violence or intimate partner violence. -- STD screening: gonorrhea/chlamydia NAAT not collected per patient request. -- pap smear collected per [facility] guidelines  Thomasene Mohair, MD 09/11/2020 10:00 AM

## 2020-09-17 DIAGNOSIS — Z803 Family history of malignant neoplasm of breast: Secondary | ICD-10-CM

## 2020-09-17 HISTORY — DX: Family history of malignant neoplasm of breast: Z80.3

## 2020-09-30 ENCOUNTER — Encounter: Payer: Self-pay | Admitting: Obstetrics and Gynecology

## 2020-11-13 NOTE — Telephone Encounter (Signed)
Mirena not rcvd. Patient entering menopause.

## 2021-09-29 ENCOUNTER — Other Ambulatory Visit: Payer: Self-pay | Admitting: Family Medicine

## 2021-10-08 ENCOUNTER — Other Ambulatory Visit: Payer: Self-pay | Admitting: Family Medicine

## 2021-10-08 DIAGNOSIS — N63 Unspecified lump in unspecified breast: Secondary | ICD-10-CM
